# Patient Record
Sex: Male | Born: 1965 | Race: White | Hispanic: No | Marital: Married | Smoking: Never smoker
Health system: Southern US, Community
[De-identification: ages and names within clinical notes are randomized; demographics above are authoritative.]

## PROBLEM LIST (undated history)

## (undated) DIAGNOSIS — I10 Essential (primary) hypertension: Secondary | ICD-10-CM

## (undated) HISTORY — DX: Essential (primary) hypertension: I10

---

## 2007-01-24 ENCOUNTER — Ambulatory Visit (HOSPITAL_COMMUNITY): Admission: RE | Admit: 2007-01-24 | Discharge: 2007-01-24 | Payer: Self-pay | Admitting: Family Medicine

## 2010-05-22 HISTORY — PX: LAPAROSCOPIC GASTRIC BYPASS: SUR771

## 2014-01-20 ENCOUNTER — Other Ambulatory Visit (HOSPITAL_COMMUNITY): Payer: Self-pay | Admitting: Family Medicine

## 2014-01-20 DIAGNOSIS — R198 Other specified symptoms and signs involving the digestive system and abdomen: Secondary | ICD-10-CM

## 2014-01-28 ENCOUNTER — Ambulatory Visit (HOSPITAL_COMMUNITY)
Admission: RE | Admit: 2014-01-28 | Discharge: 2014-01-28 | Disposition: A | Discharge status: Home / Self Care | Payer: Federal, State, Local not specified - PPO | Source: Ambulatory Visit | Attending: Family Medicine | Admitting: Family Medicine

## 2014-01-28 DIAGNOSIS — R198 Other specified symptoms and signs involving the digestive system and abdomen: Secondary | ICD-10-CM

## 2014-01-28 DIAGNOSIS — R1011 Right upper quadrant pain: Secondary | ICD-10-CM | POA: Insufficient documentation

## 2015-12-15 IMAGING — US US ABDOMEN COMPLETE
1 series · 14 of 25 positions shown · non-contrast
Comparison: None.

CLINICAL DATA: Right upper quadrant pain

EXAM:
ULTRASOUND ABDOMEN COMPLETE

[Series 1: us abdomen complete · 0.12mm/px · 14 of 100 slices shown]
[im 1/100]
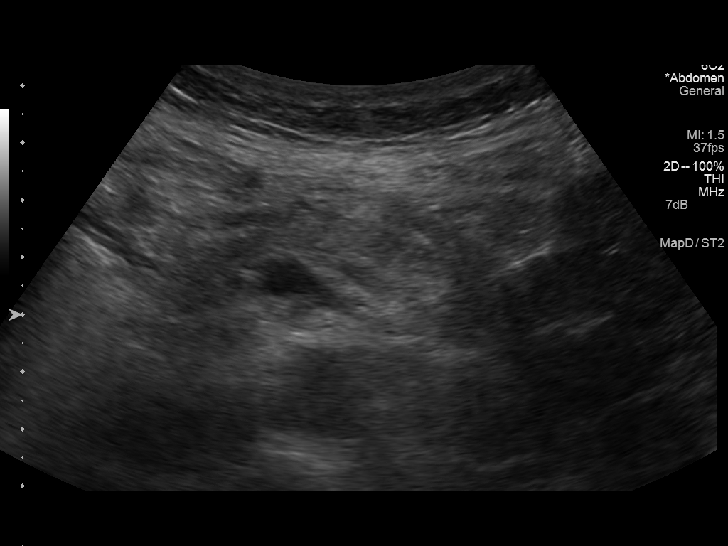
[im 9/100]
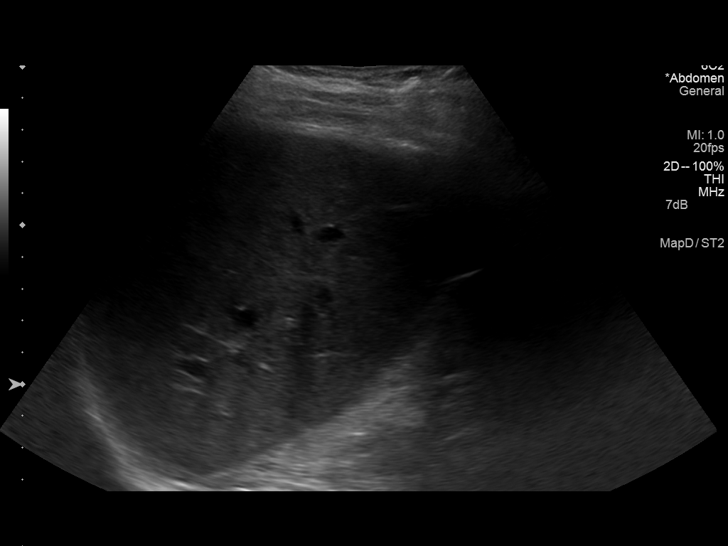
[im 17/100]
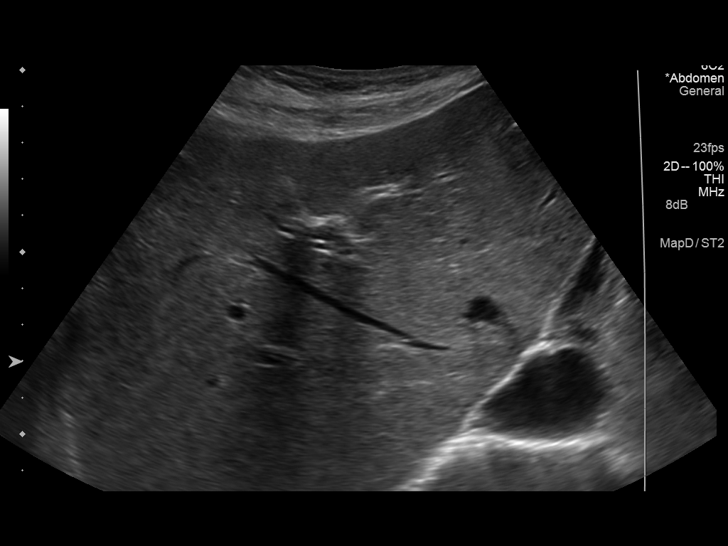
[im 25/100]
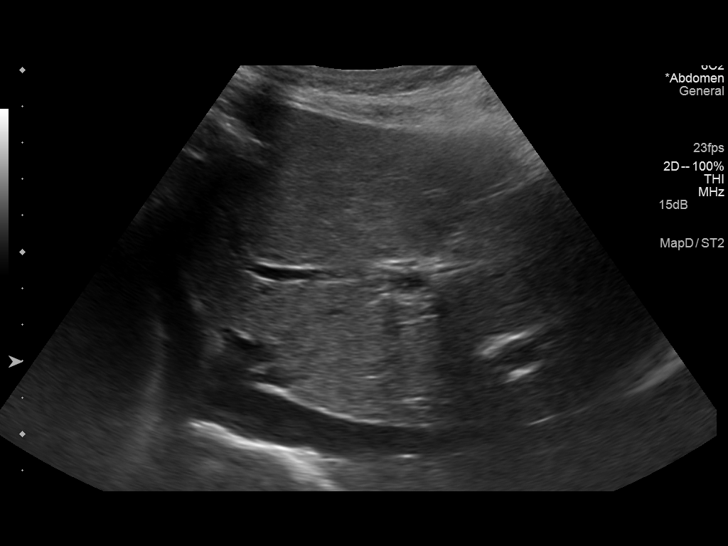
[im 34/100]
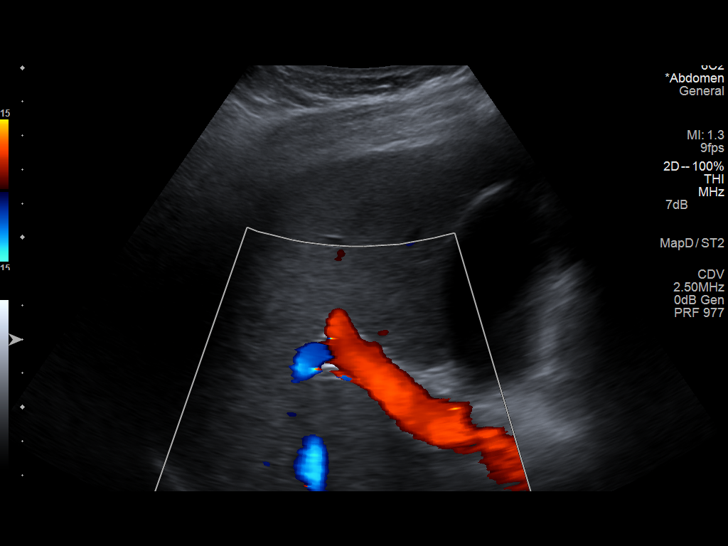
[im 38/100]
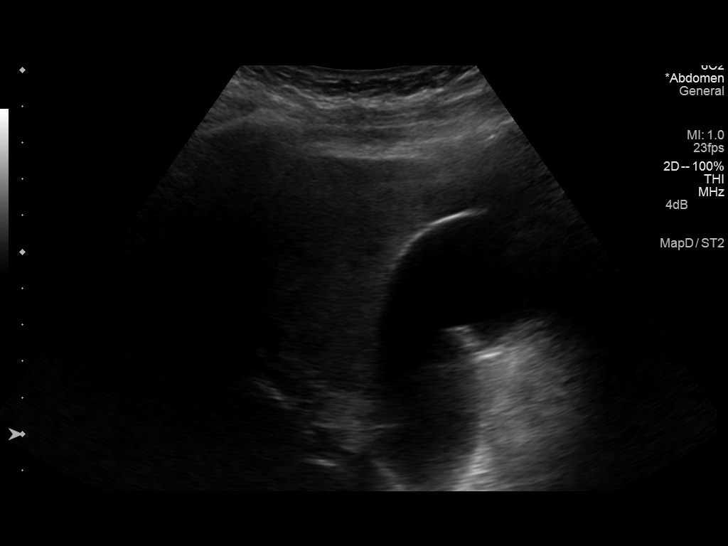
[im 46/100]
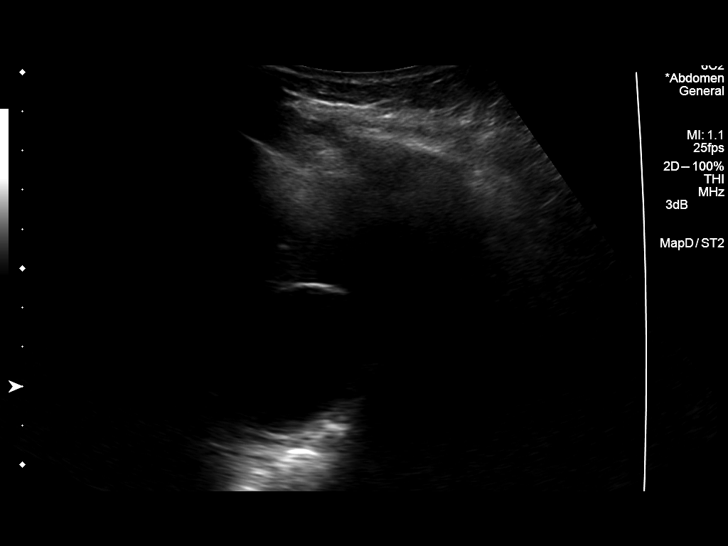
[im 54/100]
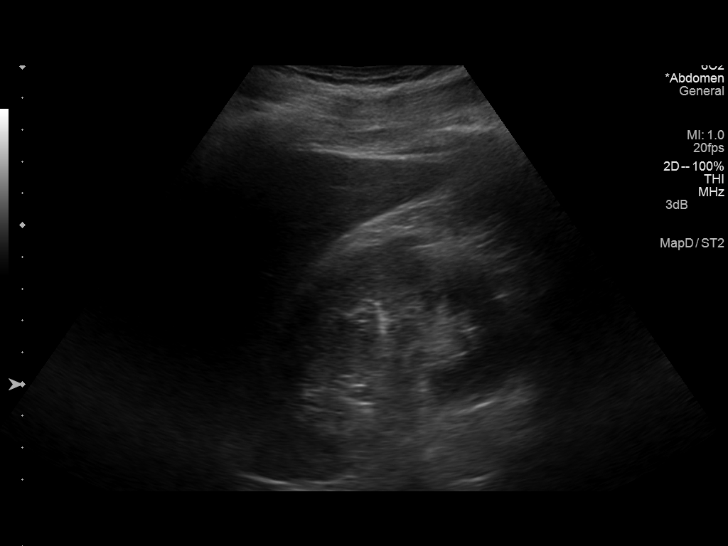
[im 62/100]
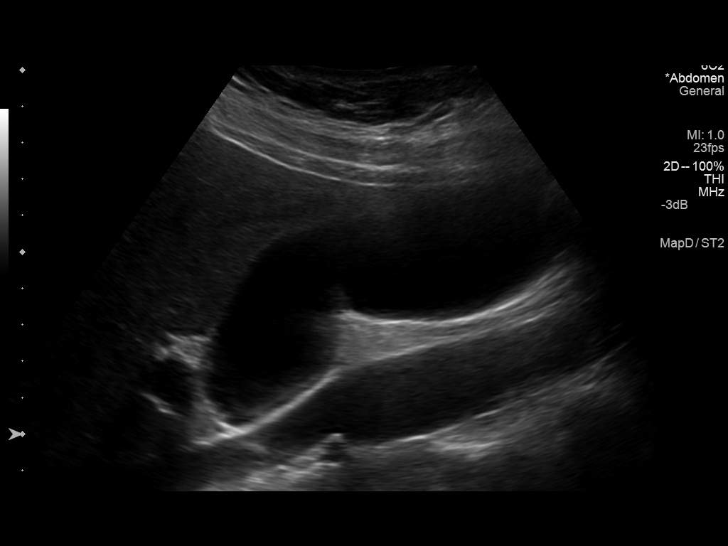
[im 67/100]
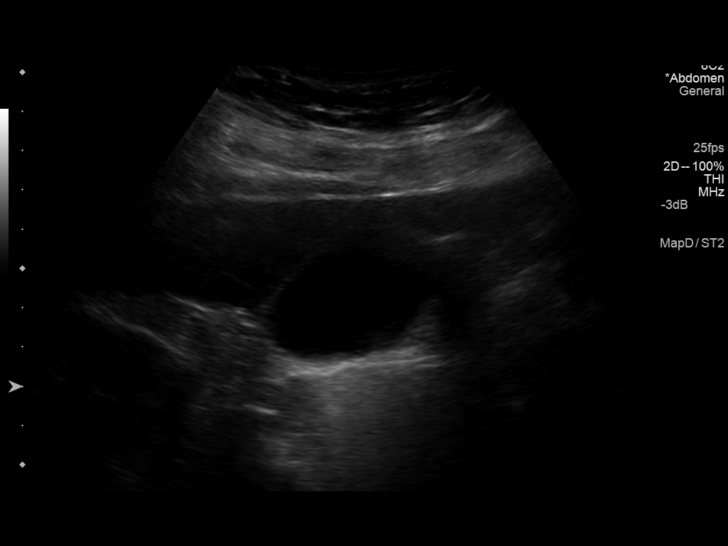
[im 75/100]
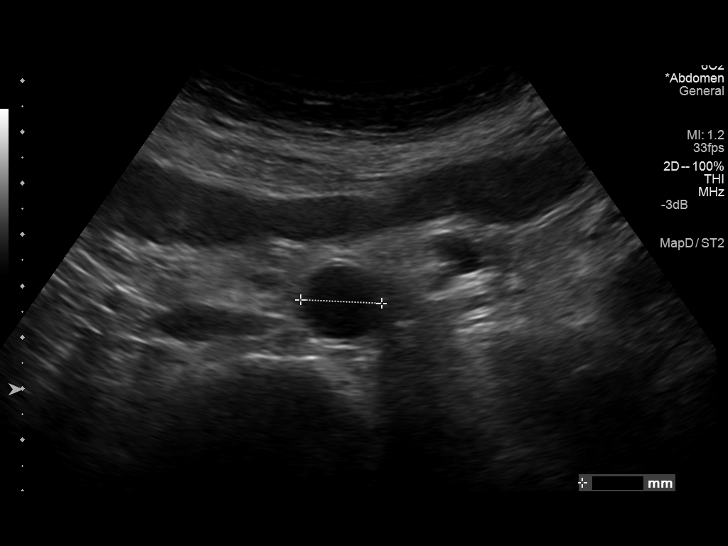
[im 83/100]
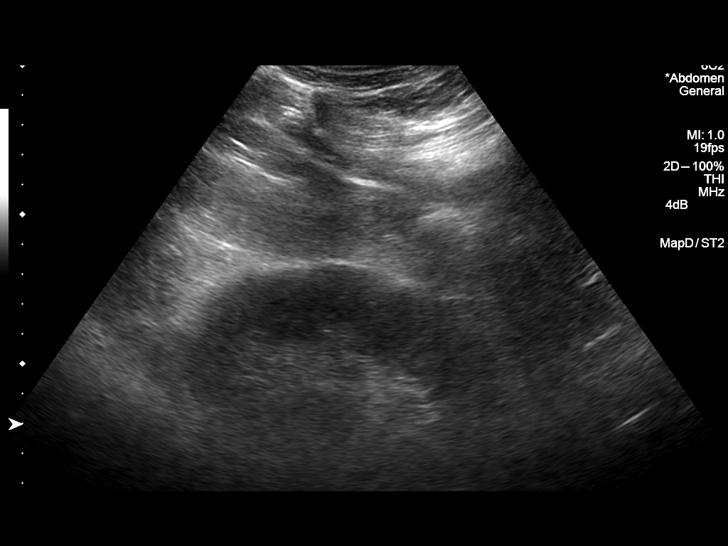
[im 91/100]
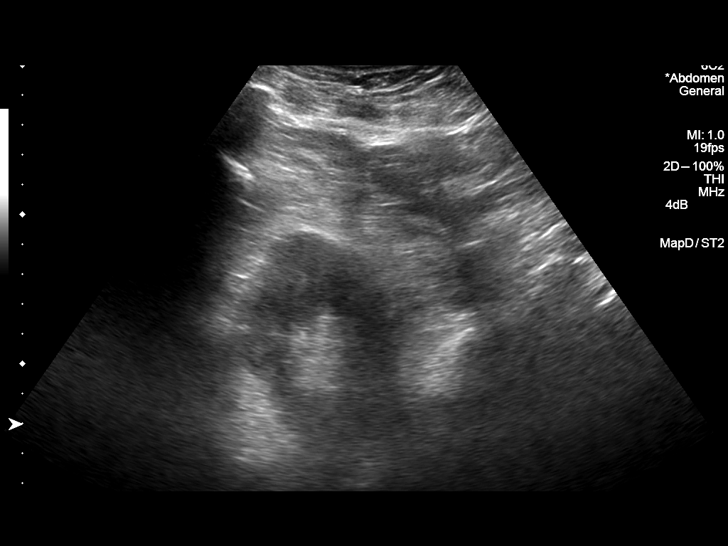
[im 100/100]
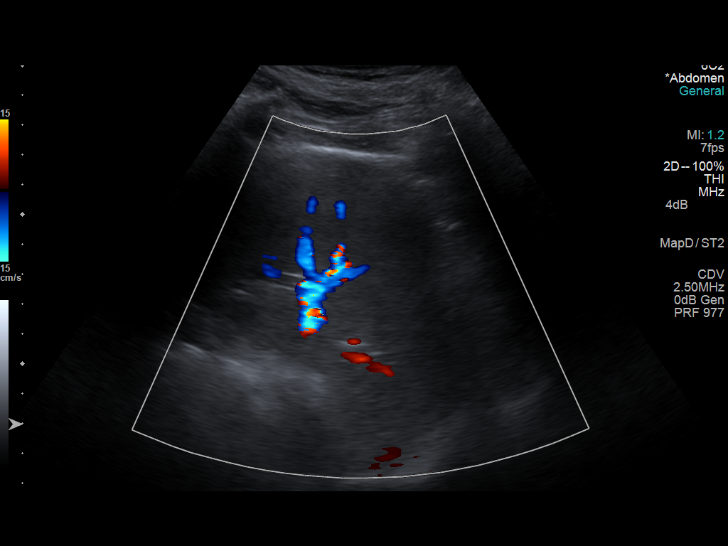

[14 of 25 positions shown; findings below may reference images not displayed]

FINDINGS: Gallbladder:

No gallstones or wall thickening visualized. No sonographic Murphy
sign noted.

Common bile duct:

Diameter: 4.2 mm.

Liver:

No focal lesion identified. Within normal limits in parenchymal
echogenicity.

IVC:

No abnormality visualized.

Pancreas:

Visualized portion unremarkable.

Spleen:

Size and appearance within normal limits.

Right Kidney:

Length: 11.4 cm.. Echogenicity within normal limits. No mass or
hydronephrosis visualized.

Left Kidney:

Length: 11.1 cm.. Echogenicity within normal limits. No mass or
hydronephrosis visualized.

Abdominal aorta:

No aneurysm visualized.

Other findings:

None.
IMPRESSION: No acute abnormality noted.

## 2016-10-16 DIAGNOSIS — Z6829 Body mass index (BMI) 29.0-29.9, adult: Secondary | ICD-10-CM | POA: Diagnosis not present

## 2016-10-16 DIAGNOSIS — Z1389 Encounter for screening for other disorder: Secondary | ICD-10-CM | POA: Diagnosis not present

## 2016-10-16 DIAGNOSIS — L987 Excessive and redundant skin and subcutaneous tissue: Secondary | ICD-10-CM | POA: Diagnosis not present

## 2016-10-16 DIAGNOSIS — E663 Overweight: Secondary | ICD-10-CM | POA: Diagnosis not present

## 2016-10-16 DIAGNOSIS — Z0000 Encounter for general adult medical examination without abnormal findings: Secondary | ICD-10-CM | POA: Diagnosis not present

## 2016-12-27 DIAGNOSIS — K080 Exfoliation of teeth due to systemic causes: Secondary | ICD-10-CM | POA: Diagnosis not present

## 2017-05-01 DIAGNOSIS — R21 Rash and other nonspecific skin eruption: Secondary | ICD-10-CM | POA: Diagnosis not present

## 2017-05-01 DIAGNOSIS — S40861A Insect bite (nonvenomous) of right upper arm, initial encounter: Secondary | ICD-10-CM | POA: Diagnosis not present

## 2017-05-29 DIAGNOSIS — A770 Spotted fever due to Rickettsia rickettsii: Secondary | ICD-10-CM | POA: Diagnosis not present

## 2017-07-08 DIAGNOSIS — K080 Exfoliation of teeth due to systemic causes: Secondary | ICD-10-CM | POA: Diagnosis not present

## 2017-12-25 DIAGNOSIS — E663 Overweight: Secondary | ICD-10-CM | POA: Diagnosis not present

## 2017-12-25 DIAGNOSIS — Z1389 Encounter for screening for other disorder: Secondary | ICD-10-CM | POA: Diagnosis not present

## 2017-12-25 DIAGNOSIS — R000 Tachycardia, unspecified: Secondary | ICD-10-CM | POA: Diagnosis not present

## 2017-12-25 DIAGNOSIS — Z23 Encounter for immunization: Secondary | ICD-10-CM | POA: Diagnosis not present

## 2017-12-25 DIAGNOSIS — Z6829 Body mass index (BMI) 29.0-29.9, adult: Secondary | ICD-10-CM | POA: Diagnosis not present

## 2017-12-25 DIAGNOSIS — Z0001 Encounter for general adult medical examination with abnormal findings: Secondary | ICD-10-CM | POA: Diagnosis not present

## 2017-12-25 DIAGNOSIS — Z9884 Bariatric surgery status: Secondary | ICD-10-CM | POA: Diagnosis not present

## 2017-12-30 ENCOUNTER — Encounter: Payer: Self-pay | Admitting: Gastroenterology

## 2018-01-07 DIAGNOSIS — K080 Exfoliation of teeth due to systemic causes: Secondary | ICD-10-CM | POA: Diagnosis not present

## 2018-01-30 ENCOUNTER — Ambulatory Visit: Payer: Federal, State, Local not specified - PPO

## 2018-02-17 ENCOUNTER — Ambulatory Visit: Payer: Federal, State, Local not specified - PPO

## 2018-02-20 ENCOUNTER — Ambulatory Visit (INDEPENDENT_AMBULATORY_CARE_PROVIDER_SITE_OTHER): Payer: Self-pay

## 2018-02-20 DIAGNOSIS — Z1211 Encounter for screening for malignant neoplasm of colon: Secondary | ICD-10-CM

## 2018-02-20 NOTE — Progress Notes (Signed)
Gastroenterology Pre-Procedure Review  Request Date:02/20/18 Requesting Physician: Dr.Golding (no previous tcs)  PATIENT REVIEW QUESTIONS: The patient responded to the following health history questions as indicated:    1. Diabetes Melitis: no 2. Joint replacements in the past 12 months: no 3. Major health problems in the past 3 months: no 4. Has an artificial valve or MVP: no 5. Has a defibrillator: no 6. Has been advised in past to take antibiotics in advance of a procedure like teeth cleaning: no 7. Family history of colon cancer: no  8. Alcohol Use: no 9. History of sleep apnea: no  10. History of coronary artery or other vascular stents placed within the last 12 months: no 11. History of any prior anesthesia complications: no    MEDICATIONS & ALLERGIES:    Patient reports the following regarding taking any blood thinners:   Plavix? no Aspirin? yes ( ) Coumadin? no Brilinta? no Xarelto? no Eliquis? no Pradaxa? no Savaysa? no Effient? no  Patient confirms/reports the following medications:  Current Outpatient Medications  Medication Sig Dispense Refill  . aspirin EC 81 MG tablet Take by mouth.    . Calcium Carb-Cholecalciferol (OYSTER SHELL CALCIUM/VITAMIN D) 500-200 MG-UNIT TABS Take by mouth.    . Cholecalciferol (VITAMIN D-1000 MAX ST) 1000 units tablet Take by mouth.    . IRON-VITAMIN C PO Take by mouth.    . Multiple Vitamin (MULTIVITAMIN) tablet Take 1 tablet by mouth 3 (three) times daily.     No current facility-administered medications for this visit.     Patient confirms/reports the following allergies:  No Known Allergies  No orders of the defined types were placed in this encounter.   AUTHORIZATION INFORMATION Primary Insurance: BCBS [place],  [place] #: [id number] Pre-Cert / Berkley Harvey required: no   SCHEDULE INFORMATION: Procedure has been scheduled as follows:  Date: 03/21/18, Time: 1:30 Location: [facility] Dr.Fields  This Gastroenterology Pre-Precedure  Review Form is being routed to the following provider(s): Lewie Loron NP

## 2018-02-20 NOTE — Progress Notes (Signed)
Appropriate. Hold iron X 7 days.

## 2018-02-20 NOTE — Patient Instructions (Addendum)
Terry Mccoy  [birth year] MRN: [id number]     Procedure Date: 04/28/18 Time to register: 9:30 Place to register: [facility] Procedure Time: 10:30 Scheduled provider: Barney Drain, MD    PREPARATION FOR COLONOSCOPY WITH SUPREP BOWEL PREP KIT  Note: Suprep Bowel Prep Kit is a split-dose (2day) regimen. Consumption of BOTH 6-ounce bottles is required for a complete prep.  Please notify us immediately if you are diabetic, take iron supplements, or if you are on Coumadin or any other blood thinners.  Hold iron for 7 days prior to procedure.                                                                                                                                                  1 DAY BEFORE PROCEDURE:  DATE: 04/27/18   DAY: Sunday  clear liquids the entire day - NO SOLID FOOD.     At 6:00pm: Complete steps 1 through 4 below, using ONE (1) 6-ounce bottle, before going to bed. Step 1:  Pour ONE (1) 6-ounce bottle of SUPREP liquid into the mixing container.  Step 2:  Add cool drinking water to the 16 ounce line on the container and mix.  Note: Dilute the solution concentrate as directed prior to use. Step 3:  DRINK ALL the liquid in the container. Step 4:  You MUST drink an additional two (2) or more 16 ounce containers of water over the next one (1) hour.   Continue clear liquids only   If you take medications for your heart, blood pressure, or breathing, you may take these medications with a small amount of clear liquid.   DAY OF PROCEDURE:   DATE: 04/28/18  DAY: Monday    5 hours before your procedure at 5:30:am: Step 1:  Pour ONE (1) 6-ounce bottle of SUPREP liquid into the mixing container.  Step 2:  Add cool drinking water to the 16 ounce line on the container and mix.  Note: Dilute the solution concentrate as directed prior to use. Step 3:  DRINK ALL the liquid in the container. Step 4:  You MUST drink an additional two (2) or more 16 ounce containers of  water over the next one (1) hour. You MUST complete the final glass of water at least 3 hours before your colonoscopy.   Nothing by mouth past 7:30am  You may take your morning medications with sip of water unless we have instructed otherwise.    Please see below for Dietary Information.  CLEAR LIQUIDS INCLUDE:  Water Jello (NOT red in color)   Ice Popsicles (NOT red in color)   Tea (sugar ok, no milk/cream) Powdered fruit flavored drinks  Coffee (sugar ok, no milk/cream) Gatorade/ Lemonade/ Kool-Aid  (NOT red in color)   Juice: apple, white grape, white cranberry Soft drinks  Clear bullion, consomme, broth (fat  free beef/chicken/vegetable)  Carbonated beverages (any kind)  Strained chicken noodle soup Hard Candy   Remember: Clear liquids are liquids that will allow you to see your fingers on the other side of a clear glass. Be sure liquids are NOT red in color, and not cloudy, but CLEAR.  DO NOT EAT OR DRINK ANY OF THE FOLLOWING:  Dairy products of any kind   Cranberry juice Tomato juice / V8 juice   Grapefruit juice Orange juice     Red grape juice  Do not eat any solid foods, including such foods as: cereal, oatmeal, yogurt, fruits, vegetables, creamed soups, eggs, bread, crackers, pureed foods in a blender, etc.   HELPFUL HINTS FOR DRINKING PREP SOLUTION:   Make sure prep is extremely cold. Mix and refrigerate the the morning of the prep. You may also put in the freezer.   You may try mixing some Crystal Light or Country Time Lemonade if you prefer. Mix in small amounts; add more if necessary.  Try drinking through a straw  Rinse mouth with water or a mouthwash between glasses, to remove after-taste.  Try sipping on a cold beverage /ice/ popsicles between glasses of prep.  Place a piece of sugar-free hard candy in mouth between glasses.  If you become nauseated, try consuming smaller amounts, or stretch out the time between glasses. Stop for 30-60 minutes, then slowly  start back drinking.     OTHER INSTRUCTIONS  You will need a responsible adult at least 52 years of age to accompany you and drive you home. This person must remain in the waiting room during your procedure. The hospital will cancel your procedure if you do not have a responsible adult with you.   1. Wear loose fitting clothing that is easily removed. 2. Leave jewelry and other valuables at home.  3. Remove all body piercing jewelry and leave at home. 4. Total time from sign-in until discharge is approximately 2-3 hours. 5. You should go home directly after your procedure and rest. You can resume normal activities the day after your procedure. 6. The day of your procedure you should not:  Drive  Make legal decisions  Operate machinery  Drink alcohol  Return to work   You may call the office (Dept: [phone]) before 5:00pm, or page the doctor on call [phone]) after 5:00pm, for further instructions, if necessary.   Insurance Information YOU WILL NEED TO CHECK WITH YOUR INSURANCE COMPANY FOR THE BENEFITS OF COVERAGE YOU HAVE FOR THIS PROCEDURE.  UNFORTUNATELY, NOT ALL INSURANCE COMPANIES HAVE BENEFITS TO COVER ALL OR PART OF THESE TYPES OF PROCEDURES.  IT IS YOUR RESPONSIBILITY TO CHECK YOUR BENEFITS, HOWEVER, WE WILL BE GLAD TO ASSIST YOU WITH ANY CODES YOUR INSURANCE COMPANY MAY NEED.    PLEASE NOTE THAT MOST INSURANCE COMPANIES WILL NOT COVER A SCREENING COLONOSCOPY FOR PEOPLE UNDER THE AGE OF 50  IF YOU HAVE BCBS INSURANCE, YOU MAY HAVE BENEFITS FOR A SCREENING COLONOSCOPY BUT IF POLYPS ARE FOUND THE DIAGNOSIS WILL CHANGE AND THEN YOU MAY HAVE A DEDUCTIBLE THAT WILL NEED TO BE MET. SO PLEASE MAKE SURE YOU CHECK YOUR BENEFITS FOR A SCREENING COLONOSCOPY AS WELL AS A DIAGNOSTIC COLONOSCOPY.

## 2018-02-20 NOTE — Progress Notes (Signed)
Pt is taking iron and taking multivitamins tid. He had gastric bypass.  He is going to check his multivitamins and see if they contain iron.  Does he need to hold his iron for 7 days?

## 2018-02-21 NOTE — Progress Notes (Signed)
Letter mailed to the pt. 

## 2018-02-24 ENCOUNTER — Telehealth: Payer: Self-pay | Admitting: Gastroenterology

## 2018-02-24 NOTE — Telephone Encounter (Signed)
Pt LMOM that he needed to reschedule his colonoscopy on 6/14 with [facility]. Please call [phone]

## 2018-02-24 NOTE — Telephone Encounter (Signed)
Tried to call pt- NA- LMOM 

## 2018-02-28 NOTE — Telephone Encounter (Signed)
Spoke with the pt, changed date of procedure. New instructions mailed to the pt.

## 2018-02-28 NOTE — Progress Notes (Signed)
Pt called and changed date of procedure. New instructions have been mailed and I called Eber Jones to change date and time.

## 2018-03-31 DIAGNOSIS — K080 Exfoliation of teeth due to systemic causes: Secondary | ICD-10-CM | POA: Diagnosis not present

## 2018-04-28 ENCOUNTER — Encounter (HOSPITAL_COMMUNITY)
Admission: RE | Disposition: A | Discharge status: Home / Self Care | Payer: Self-pay | Source: Ambulatory Visit | Attending: Gastroenterology

## 2018-04-28 ENCOUNTER — Ambulatory Visit (HOSPITAL_COMMUNITY)
Admission: RE | Admit: 2018-04-28 | Discharge: 2018-04-28 | Disposition: A | Discharge status: Home / Self Care | Payer: Federal, State, Local not specified - PPO | Source: Ambulatory Visit | Attending: Gastroenterology | Admitting: Gastroenterology

## 2018-04-28 ENCOUNTER — Other Ambulatory Visit: Payer: Self-pay

## 2018-04-28 ENCOUNTER — Encounter (HOSPITAL_COMMUNITY): Payer: Self-pay

## 2018-04-28 DIAGNOSIS — Z79899 Other long term (current) drug therapy: Secondary | ICD-10-CM | POA: Insufficient documentation

## 2018-04-28 DIAGNOSIS — K648 Other hemorrhoids: Secondary | ICD-10-CM | POA: Insufficient documentation

## 2018-04-28 DIAGNOSIS — Z9884 Bariatric surgery status: Secondary | ICD-10-CM | POA: Insufficient documentation

## 2018-04-28 DIAGNOSIS — Q438 Other specified congenital malformations of intestine: Secondary | ICD-10-CM | POA: Diagnosis not present

## 2018-04-28 DIAGNOSIS — Z1211 Encounter for screening for malignant neoplasm of colon: Secondary | ICD-10-CM

## 2018-04-28 DIAGNOSIS — Z7982 Long term (current) use of aspirin: Secondary | ICD-10-CM | POA: Insufficient documentation

## 2018-04-28 HISTORY — PX: COLONOSCOPY: SHX5424

## 2018-04-28 SURGERY — COLONOSCOPY
Anesthesia: Moderate Sedation

## 2018-04-28 MED ORDER — MEPERIDINE HCL 100 MG/ML IJ SOLN
INTRAMUSCULAR | Status: AC
  Filled 2018-04-28: qty 2

## 2018-04-28 MED ORDER — MIDAZOLAM HCL 5 MG/5ML IJ SOLN
INTRAMUSCULAR | Status: DC | PRN
  Administered 2018-04-28: 2 mg via INTRAVENOUS
  Administered 2018-04-28: 1 mg via INTRAVENOUS
  Administered 2018-04-28: 2 mg via INTRAVENOUS

## 2018-04-28 MED ORDER — MEPERIDINE HCL 100 MG/ML IJ SOLN
INTRAMUSCULAR | Status: DC | PRN
  Administered 2018-04-28 (×3): 25 mg via INTRAVENOUS

## 2018-04-28 MED ORDER — SODIUM CHLORIDE 0.9 % IV SOLN: INTRAVENOUS | Status: DC

## 2018-04-28 MED ORDER — MIDAZOLAM HCL 5 MG/5ML IJ SOLN
INTRAMUSCULAR | Status: AC
  Filled 2018-04-28: qty 10

## 2018-04-28 MED ORDER — STERILE WATER FOR IRRIGATION IR SOLN
Status: DC | PRN
  Administered 2018-04-28: 1.5 mL

## 2018-04-28 NOTE — H&P (Signed)
Primary Care Physician:  Sharilyn Sites, MD Primary Gastroenterologist:  Dr. Oneida Alar  Pre-Procedure History & Physical: HPI:  Terry Mccoy is a 52 y.o. male here for COLON CANCER SCREENING.  History reviewed. No pertinent past medical history.  Past Surgical History:  Procedure Laterality Date  . LAPAROSCOPIC GASTRIC BYPASS  05/22/2010    Prior to Admission medications   Medication Sig Start Date End Date Taking? Authorizing Provider  aspirin EC 81 MG tablet Take by mouth.   Yes [provider]  Calcium Carb-Cholecalciferol (OYSTER SHELL CALCIUM/VITAMIN D) 500-200 MG-UNIT TABS Take 2 tablets by mouth 3 (three) times daily.    Yes [provider]  Cholecalciferol (VITAMIN D3) 2000 units capsule Take 2,000 Units by mouth daily.   Yes [provider]  Ferrous Fumarate (IRON) 18 MG TBCR Take 1 tablet by mouth daily.   Yes [provider]  Multiple Vitamin (MULTIVITAMIN) tablet Take 1 tablet by mouth 3 (three) times daily.   Yes [provider]    Allergies as of 02/20/2018  . (No Known Allergies)    Family History  Problem Relation Age of Onset  . Multiple myeloma Brother   . Leukemia Brother   . Colon cancer Neg Hx   . Colon polyps Neg Hx     Social History   Socioeconomic History  . Marital status: Single    Spouse name: Not on file  . Number of children: Not on file  . Years of education: Not on file  . Highest education level: Not on file  Occupational History  . Not on file  Social Needs  . Financial resource strain: Not on file  . Food insecurity:    Worry: Not on file    Inability: Not on file  . Transportation needs:    Medical: Not on file    Non-medical: Not on file  Tobacco Use  . Smoking status: Never Smoker  . Smokeless tobacco: Never Used  Substance and Sexual Activity  . Alcohol use: Never    Frequency: Never  . Drug use: Never  . Sexual activity: Not on file  Lifestyle  . Physical activity:   Days per week: Not on file    Minutes per session: Not on file  . Stress: Not on file  Relationships  . Social connections:    Talks on phone: Not on file    Gets together: Not on file    Attends religious service: Not on file    Active member of club or organization: Not on file    Attends meetings of clubs or organizations: Not on file    Relationship status: Not on file  . Intimate partner violence:    Fear of current or ex partner: Not on file    Emotionally abused: Not on file    Physically abused: Not on file    Forced sexual activity: Not on file  Other Topics Concern  . Not on file  Social History Narrative  . Not on file    Review of Systems: See HPI, otherwise negative ROS   Physical Exam: BP (!) 148/82   Pulse 82   Temp 98.8 F (37.1 C) (Oral)   Resp 12   Ht '5\' 7"'$  (1.702 m)   Wt 185 lb (83.9 kg)   SpO2 100%   BMI 28.98 kg/m  General:   Alert,  pleasant and cooperative in NAD Head:  Normocephalic and atraumatic. Neck:  Supple; Lungs:  Clear throughout to auscultation.  Heart:  Regular rate and rhythm. Abdomen:  Soft, nontender and nondistended. Normal bowel sounds, without guarding, and without rebound.   Neurologic:  Alert and  oriented x4;  grossly normal neurologically.  Impression/Plan:    SCREENING  Plan:  1. TCS TODAY DISCUSSED PROCEDURE, BENEFITS, & RISKS: < 1% chance of medication reaction, bleeding, perforation, or rupture of spleen/liver.

## 2018-04-28 NOTE — Discharge Instructions (Signed)
You have SMALL internal hemorrhoids. YOU DID NOT HAVE ANY POLYPS.   DRINK WATER TO KEEP YOUR URINE LIGHT YELLOW.  FOLLOW A HIGH FIBER DIET. AVOID ITEMS THAT CAUSE BLOATING. SEE INFO BELOW.  USE PREPARATION H FOUR TIMES  A DAY IF NEEDED TO RELIEVE RECTAL PAIN/PRESSURE/BLEEDING.  Next colonoscopy in 10 years.  Colonoscopy Care After Read the instructions outlined below and refer to this sheet in the next week. These discharge instructions provide you with general information on caring for yourself after you leave the hospital. While your treatment has been planned according to the most current medical practices available, unavoidable complications occasionally occur. If you have any problems or questions after discharge, call DR. Eltha Tingley, [phone].  ACTIVITY  You may resume your regular activity, but move at a slower pace for the next 24 hours.   Take frequent rest periods for the next 24 hours.   Walking will help get rid of the air and reduce the bloated feeling in your belly (abdomen).   No driving for 24 hours (because of the medicine (anesthesia) used during the test).   You may shower.   Do not sign any important legal documents or operate any machinery for 24 hours (because of the anesthesia used during the test).    NUTRITION  Drink plenty of fluids.   You may resume your normal diet as instructed by your doctor.   Begin with a light meal and progress to your normal diet. Heavy or fried foods are harder to digest and may make you feel sick to your stomach (nauseated).   Avoid alcoholic beverages for 24 hours or as instructed.    MEDICATIONS  You may resume your normal medications.   WHAT YOU CAN EXPECT TODAY  Some feelings of bloating in the abdomen.   Passage of more gas than usual.   Spotting of blood in your stool or on the toilet paper  .  IF YOU HAD POLYPS REMOVED DURING THE COLONOSCOPY:  Eat a soft diet IF YOU HAVE NAUSEA, BLOATING, ABDOMINAL  PAIN, OR VOMITING.    FINDING OUT THE RESULTS OF YOUR TEST Not all test results are available during your visit. DR. Darrick PennaFIELDS WILL CALL YOU WITHIN 14 DAYS OF YOUR PROCEDUE WITH YOUR RESULTS. Do not assume everything is normal if you have not heard from DR. Shaquila Sigman, CALL HER OFFICE AT [phone].  SEEK IMMEDIATE MEDICAL ATTENTION AND CALL THE OFFICE: [phone] IF:  You have more than a spotting of blood in your stool.   Your belly is swollen (abdominal distention).   You are nauseated or vomiting.   You have a temperature over 101F.   You have abdominal pain or discomfort that is severe or gets worse throughout the day.  High-Fiber Diet A high-fiber diet changes your normal diet to include more whole grains, legumes, fruits, and vegetables. Changes in the diet involve replacing refined carbohydrates with unrefined foods. The calorie level of the diet is essentially unchanged. The Dietary Reference Intake (recommended amount) for adult males is 38 grams per day. For adult females, it is 25 grams per day. Pregnant and lactating women should consume 28 grams of fiber per day. Fiber is the intact part of a plant that is not broken down during digestion. Functional fiber is fiber that has been isolated from the plant to provide a beneficial effect in the body.  PURPOSE  Increase stool bulk.   Ease and regulate bowel movements.   Lower cholesterol.   REDUCE RISK OF COLON  CANCER  INDICATIONS THAT YOU NEED MORE FIBER  Constipation and hemorrhoids.   Uncomplicated diverticulosis (intestine condition) and irritable bowel syndrome.   Weight management.   As a protective measure against hardening of the arteries (atherosclerosis), diabetes, and cancer.   GUIDELINES FOR INCREASING FIBER IN THE DIET  Start adding fiber to the diet slowly. A gradual increase of about 5 more grams (2 slices of whole-wheat bread, 2 servings of most fruits or vegetables, or 1 bowl of high-fiber cereal) per  day is best. Too rapid an increase in fiber may result in constipation, flatulence, and bloating.   Drink enough water and fluids to keep your urine clear or pale yellow. Water, juice, or caffeine-free drinks are recommended. Not drinking enough fluid may cause constipation.   Eat a variety of high-fiber foods rather than one type of fiber.   Try to increase your intake of fiber through using high-fiber foods rather than fiber pills or supplements that contain small amounts of fiber.   The goal is to change the types of food eaten. Do not supplement your present diet with high-fiber foods, but replace foods in your present diet.    INCLUDE A VARIETY OF FIBER SOURCES  Replace refined and processed grains with whole grains, canned fruits with fresh fruits, and incorporate other fiber sources. White rice, white breads, and most bakery goods contain little or no fiber.   Brown whole-grain rice, buckwheat oats, and many fruits and vegetables are all good sources of fiber. These include: broccoli, Brussels sprouts, cabbage, cauliflower, beets, sweet potatoes, white potatoes (skin on), carrots, tomatoes, eggplant, squash, berries, fresh fruits, and dried fruits.   Cereals appear to be the richest source of fiber. Cereal fiber is found in whole grains and bran. Bran is the fiber-rich outer coat of cereal grain, which is largely removed in refining. In whole-grain cereals, the bran remains. In breakfast cereals, the largest amount of fiber is found in those with "bran" in their names. The fiber content is sometimes indicated on the label.   You may need to include additional fruits and vegetables each day.   In baking, for 1 cup white flour, you may use the following substitutions:   1 cup whole-wheat flour minus 2 tablespoons.   1/2 cup white flour plus 1/2 cup whole-wheat flour.   Hemorrhoids Hemorrhoids are dilated (enlarged) veins around the rectum. Sometimes clots will form in the veins. This  makes them swollen and painful. These are called thrombosed hemorrhoids. Causes of hemorrhoids include:  Constipation.   Straining to have a bowel movement.   HEAVY LIFTING  HOME CARE INSTRUCTIONS  Eat a well balanced diet and drink 6 to 8 glasses of water every day to avoid constipation. You may also use a bulk laxative.   Avoid straining to have bowel movements.   Keep anal area dry and clean.   Do not use a donut shaped pillow or sit on the toilet for long periods. This increases blood pooling and pain.   Move your bowels when your body has the urge; this will require less straining and will decrease pain and pressure.

## 2018-04-28 NOTE — Op Note (Signed)
[facility] Patient Name: Terry Mccoy Procedure Date: 04/28/2018 11:07 AM MRN: [id number] Date of Birth: 07/02/1966 Attending MD: Jonette EvaSandi Donaciano Range MD, MD CSN: [id number] Age: [age] Admit Type: Outpatient Procedure:                Colonoscopy, SCREENING Indications:              Screening for colorectal malignant neoplasm Providers:                Jonette EvaSandi Evalise Abruzzese MD, MD, Nena PolioLisa Moore, RN, Burke Keelsrisann                            Tilley, Technician Referring MD:             Corrie MckusickJohn C. Golding MD, MD Medicines:                Meperidine 75 mg IV, Midazolam 5 mg IV Complications:            No immediate complications. Estimated Blood Loss:     Estimated blood loss: none. Procedure:                Pre-Anesthesia Assessment:                           - Prior to the procedure, a History and Physical                            was performed, and patient medications and                            allergies were reviewed. The patient's tolerance of                            previous anesthesia was also reviewed. The risks                            and benefits of the procedure and the sedation                            options and risks were discussed with the patient.                            All questions were answered, and informed consent                            was obtained. Prior Anticoagulants: The patient has                            taken aspirin, last dose was 2 days prior to                            procedure. ASA Grade Assessment: II - A patient                            with mild systemic disease. After reviewing the  risks and benefits, the patient was deemed in                            satisfactory condition to undergo the procedure.                            After obtaining informed consent, the colonoscope                            was passed under direct vision. Throughout the                            procedure, the patient's blood pressure,  pulse, and                            oxygen saturations were monitored continuously. The                            CF-HQ190L ([id number]) scope was introduced through                            the anus and advanced to the the cecum, identified                            by appendiceal orifice and ileocecal valve. The                            colonoscopy was technically difficult and complex                            due to significant looping. Successful completion                            of the procedure was aided by increasing the dose                            of sedation medication, straightening and                            shortening the scope to obtain bowel loop reduction                            and COLOWRAP. The patient tolerated the procedure                            well. The ileocecal valve, appendiceal orifice, and                            rectum were photographed. The quality of the bowel                            preparation was good. Scope In: 11:32:55 AM Scope Out: 11:54:16 AM Scope Withdrawal Time: 0 hours 10 minutes 17 seconds  Total  Procedure Duration: 0 hours 21 minutes 21 seconds  Findings:      The recto-sigmoid colon, sigmoid colon and descending colon revealed       significantly excessive looping.      The exam was otherwise without abnormality.      Internal hemorrhoids were found during retroflexion. The hemorrhoids       were small. Impression:               - There was significant looping of the colon.                           - The examination was otherwise normal.                           - Internal hemorrhoids. Moderate Sedation:      Moderate (conscious) sedation was administered by the endoscopy nurse       and supervised by the endoscopist. The following parameters were       monitored: oxygen saturation, heart rate, blood pressure, and response       to care. Total physician intraservice time was 33 minutes. Recommendation:            - Repeat colonoscopy in 10 years for surveillance.                           - High fiber diet.                           - Continue present medications.                           - Patient has a contact number available for                            emergencies. The signs and symptoms of potential                            delayed complications were discussed with the                            patient. Return to normal activities tomorrow.                            Written discharge instructions were provided to the                            patient. Procedure Code(s):        --- Professional ---                           [phone], Colonoscopy, flexible; diagnostic, including                            collection of specimen(s) by brushing or washing,                            when performed (separate procedure)  G0500, Moderate sedation services provided by the                            same physician or other qualified health care                            professional performing a gastrointestinal                            endoscopic service that sedation supports,                            requiring the presence of an independent trained                            observer to assist in the monitoring of the                            patient's level of consciousness and physiological                            status; initial 15 minutes of intra-service time;                            patient age 52 years or older (additional time may                            be reported with [phone], as appropriate)                           [phone], Moderate sedation services provided by the                            same physician or other qualified health care                            professional performing the diagnostic or                            therapeutic service that the sedation supports,                            requiring the presence of an  independent trained                            observer to assist in the monitoring of the                            patient's level of consciousness and physiological                            status; each additional 15 minutes intraservice  time (List separately in addition to code for                            primary service) Diagnosis Code(s):        --- Professional ---                           Z12.11, Encounter for screening for malignant                            neoplasm of colon                           K64.8, Other hemorrhoids CPT copyright 2017 [employer]. All rights reserved. The codes documented in this report are preliminary and upon coder review may  be revised to meet current compliance requirements. Jonette Eva, MD Jonette Eva MD, MD 04/28/2018 12:03:30 PM This report has been signed electronically. Number of Addenda: 0

## 2018-05-01 ENCOUNTER — Encounter (HOSPITAL_COMMUNITY): Payer: Self-pay | Admitting: Gastroenterology

## 2018-05-20 ENCOUNTER — Ambulatory Visit: Payer: Federal, State, Local not specified - PPO | Admitting: Gastroenterology

## 2018-07-21 DIAGNOSIS — K080 Exfoliation of teeth due to systemic causes: Secondary | ICD-10-CM | POA: Diagnosis not present

## 2018-09-27 DIAGNOSIS — R05 Cough: Secondary | ICD-10-CM | POA: Diagnosis not present

## 2018-09-27 DIAGNOSIS — Z6830 Body mass index (BMI) 30.0-30.9, adult: Secondary | ICD-10-CM | POA: Diagnosis not present

## 2018-11-26 DIAGNOSIS — Z6832 Body mass index (BMI) 32.0-32.9, adult: Secondary | ICD-10-CM | POA: Diagnosis not present

## 2018-11-26 DIAGNOSIS — R6889 Other general symptoms and signs: Secondary | ICD-10-CM | POA: Diagnosis not present

## 2018-11-26 DIAGNOSIS — R509 Fever, unspecified: Secondary | ICD-10-CM | POA: Diagnosis not present

## 2019-07-07 DIAGNOSIS — Z23 Encounter for immunization: Secondary | ICD-10-CM | POA: Diagnosis not present

## 2019-07-07 DIAGNOSIS — Z0001 Encounter for general adult medical examination with abnormal findings: Secondary | ICD-10-CM | POA: Diagnosis not present

## 2019-07-07 DIAGNOSIS — R001 Bradycardia, unspecified: Secondary | ICD-10-CM | POA: Diagnosis not present

## 2019-07-07 DIAGNOSIS — Z6830 Body mass index (BMI) 30.0-30.9, adult: Secondary | ICD-10-CM | POA: Diagnosis not present

## 2019-07-07 DIAGNOSIS — Z1389 Encounter for screening for other disorder: Secondary | ICD-10-CM | POA: Diagnosis not present

## 2019-10-20 DIAGNOSIS — Z23 Encounter for immunization: Secondary | ICD-10-CM | POA: Diagnosis not present

## 2019-11-23 DIAGNOSIS — D2221 Melanocytic nevi of right ear and external auricular canal: Secondary | ICD-10-CM | POA: Diagnosis not present

## 2019-11-23 DIAGNOSIS — L720 Epidermal cyst: Secondary | ICD-10-CM | POA: Diagnosis not present

## 2019-11-23 DIAGNOSIS — D2239 Melanocytic nevi of other parts of face: Secondary | ICD-10-CM | POA: Diagnosis not present

## 2020-03-11 DIAGNOSIS — Z719 Counseling, unspecified: Secondary | ICD-10-CM | POA: Diagnosis not present

## 2020-03-11 DIAGNOSIS — L237 Allergic contact dermatitis due to plants, except food: Secondary | ICD-10-CM | POA: Diagnosis not present

## 2020-08-10 DIAGNOSIS — K080 Exfoliation of teeth due to systemic causes: Secondary | ICD-10-CM | POA: Diagnosis not present

## 2020-11-28 DIAGNOSIS — Z1331 Encounter for screening for depression: Secondary | ICD-10-CM | POA: Diagnosis not present

## 2020-11-28 DIAGNOSIS — Z6831 Body mass index (BMI) 31.0-31.9, adult: Secondary | ICD-10-CM | POA: Diagnosis not present

## 2020-11-28 DIAGNOSIS — Z9884 Bariatric surgery status: Secondary | ICD-10-CM | POA: Diagnosis not present

## 2020-11-28 DIAGNOSIS — Z1389 Encounter for screening for other disorder: Secondary | ICD-10-CM | POA: Diagnosis not present

## 2020-11-28 DIAGNOSIS — R001 Bradycardia, unspecified: Secondary | ICD-10-CM | POA: Diagnosis not present

## 2020-11-28 DIAGNOSIS — Z0001 Encounter for general adult medical examination with abnormal findings: Secondary | ICD-10-CM | POA: Diagnosis not present

## 2021-04-26 DIAGNOSIS — L821 Other seborrheic keratosis: Secondary | ICD-10-CM | POA: Diagnosis not present

## 2021-04-26 DIAGNOSIS — L820 Inflamed seborrheic keratosis: Secondary | ICD-10-CM | POA: Diagnosis not present

## 2021-04-26 DIAGNOSIS — L720 Epidermal cyst: Secondary | ICD-10-CM | POA: Diagnosis not present

## 2021-04-26 DIAGNOSIS — L738 Other specified follicular disorders: Secondary | ICD-10-CM | POA: Diagnosis not present

## 2021-04-26 DIAGNOSIS — D224 Melanocytic nevi of scalp and neck: Secondary | ICD-10-CM | POA: Diagnosis not present

## 2021-08-20 DIAGNOSIS — Z6831 Body mass index (BMI) 31.0-31.9, adult: Secondary | ICD-10-CM | POA: Diagnosis not present

## 2021-08-20 DIAGNOSIS — L237 Allergic contact dermatitis due to plants, except food: Secondary | ICD-10-CM | POA: Diagnosis not present

## 2021-08-24 DIAGNOSIS — R21 Rash and other nonspecific skin eruption: Secondary | ICD-10-CM | POA: Diagnosis not present

## 2021-08-24 DIAGNOSIS — Z6831 Body mass index (BMI) 31.0-31.9, adult: Secondary | ICD-10-CM | POA: Diagnosis not present

## 2021-08-24 DIAGNOSIS — T7840XD Allergy, unspecified, subsequent encounter: Secondary | ICD-10-CM | POA: Diagnosis not present

## 2022-05-16 DIAGNOSIS — M9901 Segmental and somatic dysfunction of cervical region: Secondary | ICD-10-CM | POA: Diagnosis not present

## 2022-05-16 DIAGNOSIS — M9902 Segmental and somatic dysfunction of thoracic region: Secondary | ICD-10-CM | POA: Diagnosis not present

## 2022-05-16 DIAGNOSIS — M4312 Spondylolisthesis, cervical region: Secondary | ICD-10-CM | POA: Diagnosis not present

## 2022-05-16 DIAGNOSIS — M542 Cervicalgia: Secondary | ICD-10-CM | POA: Diagnosis not present

## 2022-05-16 DIAGNOSIS — G542 Cervical root disorders, not elsewhere classified: Secondary | ICD-10-CM | POA: Diagnosis not present

## 2022-05-16 DIAGNOSIS — M47812 Spondylosis without myelopathy or radiculopathy, cervical region: Secondary | ICD-10-CM | POA: Diagnosis not present

## 2022-05-16 DIAGNOSIS — M4004 Postural kyphosis, thoracic region: Secondary | ICD-10-CM | POA: Diagnosis not present

## 2022-05-23 DIAGNOSIS — M4004 Postural kyphosis, thoracic region: Secondary | ICD-10-CM | POA: Diagnosis not present

## 2022-05-23 DIAGNOSIS — M9901 Segmental and somatic dysfunction of cervical region: Secondary | ICD-10-CM | POA: Diagnosis not present

## 2022-05-23 DIAGNOSIS — G542 Cervical root disorders, not elsewhere classified: Secondary | ICD-10-CM | POA: Diagnosis not present

## 2022-05-23 DIAGNOSIS — M9902 Segmental and somatic dysfunction of thoracic region: Secondary | ICD-10-CM | POA: Diagnosis not present

## 2022-06-01 DIAGNOSIS — M4004 Postural kyphosis, thoracic region: Secondary | ICD-10-CM | POA: Diagnosis not present

## 2022-06-01 DIAGNOSIS — M9902 Segmental and somatic dysfunction of thoracic region: Secondary | ICD-10-CM | POA: Diagnosis not present

## 2022-06-01 DIAGNOSIS — G542 Cervical root disorders, not elsewhere classified: Secondary | ICD-10-CM | POA: Diagnosis not present

## 2022-06-01 DIAGNOSIS — M9901 Segmental and somatic dysfunction of cervical region: Secondary | ICD-10-CM | POA: Diagnosis not present

## 2022-06-13 DIAGNOSIS — M9902 Segmental and somatic dysfunction of thoracic region: Secondary | ICD-10-CM | POA: Diagnosis not present

## 2022-06-13 DIAGNOSIS — G542 Cervical root disorders, not elsewhere classified: Secondary | ICD-10-CM | POA: Diagnosis not present

## 2022-06-13 DIAGNOSIS — M9901 Segmental and somatic dysfunction of cervical region: Secondary | ICD-10-CM | POA: Diagnosis not present

## 2022-06-13 DIAGNOSIS — M4004 Postural kyphosis, thoracic region: Secondary | ICD-10-CM | POA: Diagnosis not present

## 2022-07-11 DIAGNOSIS — M4004 Postural kyphosis, thoracic region: Secondary | ICD-10-CM | POA: Diagnosis not present

## 2022-07-11 DIAGNOSIS — G542 Cervical root disorders, not elsewhere classified: Secondary | ICD-10-CM | POA: Diagnosis not present

## 2022-07-11 DIAGNOSIS — M9901 Segmental and somatic dysfunction of cervical region: Secondary | ICD-10-CM | POA: Diagnosis not present

## 2022-07-11 DIAGNOSIS — M9902 Segmental and somatic dysfunction of thoracic region: Secondary | ICD-10-CM | POA: Diagnosis not present

## 2022-09-11 DIAGNOSIS — D2239 Melanocytic nevi of other parts of face: Secondary | ICD-10-CM | POA: Diagnosis not present

## 2022-09-11 DIAGNOSIS — L820 Inflamed seborrheic keratosis: Secondary | ICD-10-CM | POA: Diagnosis not present

## 2022-09-11 DIAGNOSIS — L821 Other seborrheic keratosis: Secondary | ICD-10-CM | POA: Diagnosis not present

## 2022-09-12 ENCOUNTER — Encounter: Payer: Self-pay | Admitting: Internal Medicine

## 2022-09-12 DIAGNOSIS — Z6831 Body mass index (BMI) 31.0-31.9, adult: Secondary | ICD-10-CM | POA: Diagnosis not present

## 2022-09-12 DIAGNOSIS — E6609 Other obesity due to excess calories: Secondary | ICD-10-CM | POA: Diagnosis not present

## 2022-09-12 DIAGNOSIS — Z9884 Bariatric surgery status: Secondary | ICD-10-CM | POA: Diagnosis not present

## 2022-09-12 DIAGNOSIS — R1310 Dysphagia, unspecified: Secondary | ICD-10-CM | POA: Diagnosis not present

## 2022-09-12 DIAGNOSIS — Z1331 Encounter for screening for depression: Secondary | ICD-10-CM | POA: Diagnosis not present

## 2022-09-12 DIAGNOSIS — Z0000 Encounter for general adult medical examination without abnormal findings: Secondary | ICD-10-CM | POA: Diagnosis not present

## 2022-09-14 ENCOUNTER — Other Ambulatory Visit: Payer: Self-pay | Admitting: *Deleted

## 2022-09-14 DIAGNOSIS — I8393 Asymptomatic varicose veins of bilateral lower extremities: Secondary | ICD-10-CM

## 2022-09-21 ENCOUNTER — Encounter: Payer: Federal, State, Local not specified - PPO | Admitting: Vascular Surgery

## 2022-09-21 ENCOUNTER — Encounter (HOSPITAL_COMMUNITY): Payer: Federal, State, Local not specified - PPO

## 2022-10-17 ENCOUNTER — Ambulatory Visit (HOSPITAL_COMMUNITY)
Admission: RE | Admit: 2022-10-17 | Discharge: 2022-10-17 | Disposition: A | Discharge status: Home / Self Care | Payer: Federal, State, Local not specified - PPO | Source: Ambulatory Visit | Attending: Physician Assistant | Admitting: Physician Assistant

## 2022-10-17 ENCOUNTER — Ambulatory Visit: Payer: Federal, State, Local not specified - PPO | Admitting: Physician Assistant

## 2022-10-17 VITALS — BP 152/81 | HR 68 | Temp 97.9°F | Ht 67.0 in | Wt 200.0 lb

## 2022-10-17 DIAGNOSIS — I872 Venous insufficiency (chronic) (peripheral): Secondary | ICD-10-CM

## 2022-10-17 DIAGNOSIS — I8393 Asymptomatic varicose veins of bilateral lower extremities: Secondary | ICD-10-CM

## 2022-10-17 NOTE — Progress Notes (Signed)
Office Note     CC:  follow up Requesting Provider:  Sharilyn Sites, MD  HPI: Terry Mccoy is a 57 y.o. (10/19/1965) male who presents for evaluation of varicose veins of left lower extremity.  He denies any symptoms from varicosities, he is more concerned about the appearance.  He denies any edema of the left leg.  He also denies any history of DVT, venous ulcerations, trauma, or prior vascular interventions.  He is not had any episodes of superficial thrombophlebitis.  He does not wear compression or elevate his legs during the day.  His job involves sitting however patient states he moves around a lot during the day as well.  He denies tobacco use.  He underwent gastric bypass surgery in 2011.  At that time he weighed 350 pounds and now weighs 200 pounds.  He states veins seem to have improved over the years since gastric bypass surgery and weight loss.  History reviewed. No pertinent past medical history.  Past Surgical History:  Procedure Laterality Date   COLONOSCOPY N/A 04/28/2018   Procedure: COLONOSCOPY;  Surgeon: Danie Binder, MD;  Location: [facility];  Service: Endoscopy;  Laterality: N/A;  1:30   LAPAROSCOPIC GASTRIC BYPASS  05/22/2010    Social History   Socioeconomic History   Marital status: Married    Spouse name: Not on file   Number of children: Not on file   Years of education: Not on file   Highest education level: Not on file  Occupational History   Not on file  Tobacco Use   Smoking status: Never   Smokeless tobacco: Never  Vaping Use   Vaping Use: Never used  Substance and Sexual Activity   Alcohol use: Never   Drug use: Never   Sexual activity: Not on file  Other Topics Concern   Not on file  Social History Narrative   Not on file   Social Determinants of Health   Financial Resource Strain: Not on file  Food Insecurity: Not on file  Transportation Needs: Not on file  Physical Activity: Not on file  Stress: Not on file  Social  Connections: Not on file  Intimate Partner Violence: Not on file    Family History  Problem Relation Age of Onset   Multiple myeloma Brother    Leukemia Brother    Colon cancer Neg Hx    Colon polyps Neg Hx     Current Outpatient Medications  Medication Sig Dispense Refill   aspirin EC 81 MG tablet Take by mouth.     Calcium Carb-Cholecalciferol (OYSTER SHELL CALCIUM/VITAMIN D) 500-200 MG-UNIT TABS Take 2 tablets by mouth 3 (three) times daily.      Cholecalciferol (VITAMIN D3) 2000 units capsule Take 2,000 Units by mouth daily.     Ferrous Fumarate (IRON) 18 MG TBCR Take 1 tablet by mouth daily.     Multiple Vitamin (MULTIVITAMIN) tablet Take 1 tablet by mouth 3 (three) times daily.     pantoprazole (PROTONIX) 40 MG tablet Take 40 mg by mouth daily.     No current facility-administered medications for this visit.    No Known Allergies   REVIEW OF SYSTEMS:   [X]  denotes positive finding, [ ]  denotes negative finding Cardiac  Comments:  Chest pain or chest pressure:    Shortness of breath upon exertion:    Short of breath when lying flat:    Irregular heart rhythm:        Vascular    Pain  in calf, thigh, or hip brought on by ambulation:    Pain in feet at night that wakes you up from your sleep:     Blood clot in your veins:    Leg swelling:         Pulmonary    Oxygen at home:    Productive cough:     Wheezing:         Neurologic    Sudden weakness in arms or legs:     Sudden numbness in arms or legs:     Sudden onset of difficulty speaking or slurred speech:    Temporary loss of vision in one eye:     Problems with dizziness:         Gastrointestinal    Blood in stool:     Vomited blood:         Genitourinary    Burning when urinating:     Blood in urine:        Psychiatric    Major depression:         Hematologic    Bleeding problems:    Problems with blood clotting too easily:        Skin    Rashes or ulcers:        Constitutional    Fever  or chills:      PHYSICAL EXAMINATION:  Vitals:   10/17/22 1230  BP: (!) 152/81  Pulse: 68  Temp: 97.9 F (36.6 C)  TempSrc: Temporal  SpO2: 100%  Weight: 200 lb (90.7 kg)  Height: 5\' 7"  (1.702 m)    General:  WDWN in NAD; vital signs documented above Gait: Not observed HENT: WNL, normocephalic Pulmonary: normal non-labored breathing , without Rales, rhonchi,  wheezing Cardiac: regular HR Abdomen: soft, NT, no masses Skin: without rashes Vascular Exam/Pulses:  Right Left  DP 2+ (normal) 2+ (normal)  PT 2+ (normal) 2+ (normal)   Extremities: without ischemic changes, without Gangrene , without cellulitis; without open wounds; varicosity along the medial left lower leg with scattered spider veins around the ankles; no pigmentation changes or edema noted Musculoskeletal: no muscle wasting or atrophy  Neurologic: A&O X 3;  No focal weakness or paresthesias are detected Psychiatric:  The pt has Normal affect.   Non-Invasive Vascular Imaging:   Left lower extremity venous reflux study negative for DVT Negative for deep venous insufficiency Incompetent GSV from the saphenofemoral junction to the mid thigh Incompetent small saphenous vein at the proximal calf    ASSESSMENT/PLAN:: 57 y.o. male here for evaluation of asymptomatic varicose veins of the left lower extremity  -Left lower extremity venous reflux study was negative for DVT.  No reflux noted in the deep venous system.  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  Recommendations included regular use of 15 to 20 mmHg knee-high compression sock, proper leg elevation periodically throughout the day, and avoiding prolonged sitting and standing.  Should he develop symptoms related to his varicosities he will follow-up.  Otherwise he can  follow-up on an as-needed basis.  He is aware we may need to repeat venous reflux study in the future.   Dagoberto Ligas, PA-C [facility] [phone]  Clinic MD:   Donzetta Matters

## 2022-10-17 NOTE — Progress Notes (Unsigned)
GI Office Note    Referring Provider: Sharilyn Sites, MD Primary Care Physician:  Sharilyn Sites, MD  Primary Gastroenterologist: Elon Alas. Abbey Chatters, DO   Chief Complaint   No chief complaint on file.    History of Present Illness   Terry Mccoy is a 57 y.o. male presenting today at the request of Sharilyn Sites, MD for ***dysphagia.   Colonoscopy July 2019 by Dr. Oneida Alar: -significant looping of colon otherwise normal -repeat in 10 years  Per referral paperwork dysphagia was mentioned and given prescription for pantoprazole 40 mg daily. GI referral placed.   Today: Medical history:  Surgical history:  Dysphagia:   Current Outpatient Medications  Medication Sig Dispense Refill   aspirin EC 81 MG tablet Take by mouth.     Calcium Carb-Cholecalciferol (OYSTER SHELL CALCIUM/VITAMIN D) 500-200 MG-UNIT TABS Take 2 tablets by mouth 3 (three) times daily.      Cholecalciferol (VITAMIN D3) 2000 units capsule Take 2,000 Units by mouth daily.     Ferrous Fumarate (IRON) 18 MG TBCR Take 1 tablet by mouth daily.     Multiple Vitamin (MULTIVITAMIN) tablet Take 1 tablet by mouth 3 (three) times daily.     pantoprazole (PROTONIX) 40 MG tablet Take 40 mg by mouth daily.     No current facility-administered medications for this visit.    No past medical history on file.  Past Surgical History:  Procedure Laterality Date   COLONOSCOPY N/A 04/28/2018   Procedure: COLONOSCOPY;  Surgeon: Danie Binder, MD;  Location: [facility];  Service: Endoscopy;  Laterality: N/A;  1:30   LAPAROSCOPIC GASTRIC BYPASS  05/22/2010    Family History  Problem Relation Age of Onset   Multiple myeloma Brother    Leukemia Brother    Colon cancer Neg Hx    Colon polyps Neg Hx     Allergies as of 10/18/2022   (No Known Allergies)    Social History   Socioeconomic History   Marital status: Married    Spouse name: Not on file   Number of children: Not on file   Years of education: Not  on file   Highest education level: Not on file  Occupational History   Not on file  Tobacco Use   Smoking status: Never   Smokeless tobacco: Never  Vaping Use   Vaping Use: Never used  Substance and Sexual Activity   Alcohol use: Never   Drug use: Never   Sexual activity: Not on file  Other Topics Concern   Not on file  Social History Narrative   Not on file   Social Determinants of Health   Financial Resource Strain: Not on file  Food Insecurity: Not on file  Transportation Needs: Not on file  Physical Activity: Not on file  Stress: Not on file  Social Connections: Not on file  Intimate Partner Violence: Not on file     Review of Systems   Gen: Denies any fever, chills, fatigue, weight loss, lack of appetite.  CV: Denies chest pain, heart palpitations, peripheral edema, syncope.  Resp: Denies shortness of breath at rest or with exertion. Denies wheezing or cough.  GI: see HPI GU : Denies urinary burning, urinary frequency, urinary hesitancy MS: Denies joint pain, muscle weakness, cramps, or limitation of movement.  Derm: Denies rash, itching, dry skin Psych: Denies depression, anxiety, memory loss, and confusion Heme: Denies bruising, bleeding, and enlarged lymph nodes.   Physical Exam   There were no vitals taken  for this visit.  General:   Alert and oriented. Pleasant and cooperative. Well-nourished and well-developed.  Head:  Normocephalic and atraumatic. Eyes:  Without icterus, sclera clear and conjunctiva pink.  Ears:  Normal auditory acuity. Mouth:  No deformity or lesions, oral mucosa pink.  Lungs:  Clear to auscultation bilaterally. No wheezes, rales, or rhonchi. No distress.  Heart:  S1, S2 present without murmurs appreciated.  Abdomen:  +BS, soft, non-tender and non-distended. No HSM noted. No guarding or rebound. No masses appreciated.  Rectal:  Deferred  Msk:  Symmetrical without gross deformities. Normal posture. Extremities:  Without  edema. Neurologic:  Alert and  oriented x4;  grossly normal neurologically. Skin:  Intact without significant lesions or rashes. Psych:  Alert and cooperative. Normal mood and affect.   Assessment   Terry Mccoy is a 57 y.o. male with a history of rocky [place] spotted fever in 2018, *** presenting today for evaluation of dysphagia.   Dysphagia:    PLAN   ***    Venetia Night, MSN, FNP-BC, AGACNP-BC [facility]

## 2022-10-18 ENCOUNTER — Ambulatory Visit: Payer: Federal, State, Local not specified - PPO | Admitting: Gastroenterology

## 2022-10-18 ENCOUNTER — Encounter: Payer: Self-pay | Admitting: Gastroenterology

## 2022-10-18 VITALS — BP 147/87 | HR 64 | Temp 97.8°F | Ht 67.0 in | Wt 198.8 lb

## 2022-10-18 DIAGNOSIS — Z9884 Bariatric surgery status: Secondary | ICD-10-CM

## 2022-10-18 DIAGNOSIS — K219 Gastro-esophageal reflux disease without esophagitis: Secondary | ICD-10-CM | POA: Diagnosis not present

## 2022-10-18 DIAGNOSIS — R1310 Dysphagia, unspecified: Secondary | ICD-10-CM

## 2022-10-18 NOTE — Patient Instructions (Addendum)
It was a pleasure to meet you today!  We are scheduling you for an endoscopy with possible dilation in the near future with Dr. Abbey Chatters.  You will receive separate written instructions regarding prep for procedure.  You need to hold your iron for 4 days prior to procedure.  Continue taking your pantoprazole 40 mg daily.  We will plan to follow-up about 6 to 8 weeks after your procedure to see how you are doing.  I want to create trusting relationships with patients. If you receive a survey regarding your visit,  I greatly appreciate you taking time to fill this out on paper or through your MyChart. I value your feedback.  Venetia Night, MSN, FNP-BC, AGACNP-BC [facility]

## 2022-10-19 ENCOUNTER — Telehealth: Payer: Self-pay | Admitting: *Deleted

## 2022-10-19 NOTE — Telephone Encounter (Signed)
[facility] to schedule for EGD/ED with Dr.Carver ASA 2

## 2022-10-23 ENCOUNTER — Ambulatory Visit: Payer: Federal, State, Local not specified - PPO | Admitting: Gastroenterology

## 2022-10-26 ENCOUNTER — Encounter: Payer: Self-pay | Admitting: *Deleted

## 2022-11-28 ENCOUNTER — Encounter (HOSPITAL_COMMUNITY): Payer: Federal, State, Local not specified - PPO

## 2022-11-30 ENCOUNTER — Ambulatory Visit (HOSPITAL_COMMUNITY)
Admission: RE | Admit: 2022-11-30 | Discharge: 2022-11-30 | Disposition: A | Discharge status: Home / Self Care | Payer: Federal, State, Local not specified - PPO | Attending: Internal Medicine | Admitting: Internal Medicine

## 2022-11-30 ENCOUNTER — Ambulatory Visit (HOSPITAL_COMMUNITY): Payer: Federal, State, Local not specified - PPO | Admitting: Anesthesiology

## 2022-11-30 ENCOUNTER — Other Ambulatory Visit: Payer: Self-pay

## 2022-11-30 ENCOUNTER — Encounter (HOSPITAL_COMMUNITY)
Admission: RE | Disposition: A | Discharge status: Home / Self Care | Payer: Self-pay | Source: Home / Self Care | Attending: Internal Medicine

## 2022-11-30 ENCOUNTER — Encounter (HOSPITAL_COMMUNITY): Payer: Self-pay

## 2022-11-30 DIAGNOSIS — I10 Essential (primary) hypertension: Secondary | ICD-10-CM | POA: Diagnosis not present

## 2022-11-30 DIAGNOSIS — Z9884 Bariatric surgery status: Secondary | ICD-10-CM | POA: Insufficient documentation

## 2022-11-30 DIAGNOSIS — K9189 Other postprocedural complications and disorders of digestive system: Secondary | ICD-10-CM | POA: Diagnosis not present

## 2022-11-30 DIAGNOSIS — K219 Gastro-esophageal reflux disease without esophagitis: Secondary | ICD-10-CM | POA: Diagnosis not present

## 2022-11-30 DIAGNOSIS — R1310 Dysphagia, unspecified: Secondary | ICD-10-CM

## 2022-11-30 DIAGNOSIS — Z980 Intestinal bypass and anastomosis status: Secondary | ICD-10-CM | POA: Diagnosis not present

## 2022-11-30 HISTORY — PX: ESOPHAGOGASTRODUODENOSCOPY (EGD) WITH PROPOFOL: SHX5813

## 2022-11-30 HISTORY — PX: BALLOON DILATION: SHX5330

## 2022-11-30 SURGERY — ESOPHAGOGASTRODUODENOSCOPY (EGD) WITH PROPOFOL
Anesthesia: General

## 2022-11-30 MED ORDER — LIDOCAINE HCL (CARDIAC) PF 100 MG/5ML IV SOSY
PREFILLED_SYRINGE | INTRAVENOUS | Status: DC | PRN
  Administered 2022-11-30: 50 mg via INTRAVENOUS

## 2022-11-30 MED ORDER — LACTATED RINGERS IV SOLN: INTRAVENOUS | Status: DC

## 2022-11-30 MED ORDER — PROPOFOL 10 MG/ML IV BOLUS
INTRAVENOUS | Status: DC | PRN
  Administered 2022-11-30: 50 mg via INTRAVENOUS
  Administered 2022-11-30: 100 mg via INTRAVENOUS
  Administered 2022-11-30: 50 mg via INTRAVENOUS

## 2022-11-30 MED ORDER — PANTOPRAZOLE SODIUM 40 MG PO TBEC: 40.0000 mg | DELAYED_RELEASE_TABLET | Freq: Two times a day (BID) | ORAL | 11 refills | Status: DC

## 2022-11-30 NOTE — Discharge Instructions (Addendum)
EGD Discharge instructions Please read the instructions outlined below and refer to this sheet in the next few weeks. These discharge instructions provide you with general information on caring for yourself after you leave the hospital. Your doctor may also give you specific instructions. While your treatment has been planned according to the most current medical practices available, unavoidable complications occasionally occur. If you have any problems or questions after discharge, please call your doctor. ACTIVITY You may resume your regular activity but move at a slower pace for the next 24 hours.  Take frequent rest periods for the next 24 hours.  Walking will help expel (get rid of) the air and reduce the bloated feeling in your abdomen.  No driving for 24 hours (because of the anesthesia (medicine) used during the test).  You may shower.  Do not sign any important legal documents or operate any machinery for 24 hours (because of the anesthesia used during the test).  NUTRITION Drink plenty of fluids.  You may resume your normal diet.  Begin with a light meal and progress to your normal diet.  Avoid alcoholic beverages for 24 hours or as instructed by your caregiver.  MEDICATIONS You may resume your normal medications unless your caregiver tells you otherwise.  WHAT YOU CAN EXPECT TODAY You may experience abdominal discomfort such as a feeling of fullness or "gas" pains.  FOLLOW-UP Your doctor will discuss the results of your test with you.  SEEK IMMEDIATE MEDICAL ATTENTION IF ANY OF THE FOLLOWING OCCUR: Excessive nausea (feeling sick to your stomach) and/or vomiting.  Severe abdominal pain and distention (swelling).  Trouble swallowing.  Temperature over 101 F (37.8 C).  Rectal bleeding or vomiting of blood.    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  This was pretty tight.  I was barely able to get the scope  through this which measures 8.8 mm.  I stretched this with a balloon today up to 15 mm.  Hopefully this helps with your symptoms.  Esophagus appeared normal.  Examined small bowel appeared normal.  I am going to increase his pantoprazole to twice daily.  Follow-up with GI in 3 months.  Message was sent to the office and they will be in touch with you about this appointment. We may need to consider further dilation in the future depending on how you do.  I hope you have a great rest of your week!  Terry Mccoy. Abbey Chatters, D.O. Gastroenterology and Hepatology [facility]

## 2022-11-30 NOTE — H&P (Signed)
Primary Care Physician:  Sharilyn Sites, MD Primary Gastroenterologist:  Dr. Abbey Chatters  Pre-Procedure History & Physical: HPI:  Terry Mccoy is a 57 y.o. male is here for an EGD with possible dilation to be performed for GERD/Dysphagia.   Past Medical History:  Diagnosis Date   HTN (hypertension)    stopped medication afte gastric bypass    Past Surgical History:  Procedure Laterality Date   COLONOSCOPY N/A 04/28/2018   Procedure: COLONOSCOPY;  Surgeon: Danie Binder, MD;  Location: [facility];  Service: Endoscopy;  Laterality: N/A;  1:30   LAPAROSCOPIC GASTRIC BYPASS  05/22/2010    Prior to Admission medications   Medication Sig Start Date End Date Taking? Authorizing Provider  aspirin EC 81 MG tablet Take by mouth.   Yes [provider]  Calcium Carb-Cholecalciferol (OYSTER SHELL CALCIUM/VITAMIN D) 500-200 MG-UNIT TABS Take 2 tablets by mouth 3 (three) times daily.    Yes [provider]  cetirizine (ZYRTEC ALLERGY) 10 MG tablet Take 10 mg by mouth daily. 01/06/22  Yes [provider]  Cholecalciferol (VITAMIN D3) 2000 units capsule Take 2,000 Units by mouth daily.   Yes [provider]  Ferrous Fumarate (IRON) 18 MG TBCR Take 1 tablet by mouth daily.   Yes [provider]  Multiple Vitamin (MULTIVITAMIN) tablet Take 1 tablet by mouth 3 (three) times daily.   Yes [provider]  pantoprazole (PROTONIX) 40 MG tablet Take 40 mg by mouth daily.   Yes [provider]    Allergies as of 10/26/2022   (No Known Allergies)    Family History  Problem Relation Age of Onset   Heart attack Mother 76   Diabetes Mother    Multiple myeloma Brother    Leukemia Brother    Heart disease Maternal Grandmother    Diabetes Maternal Grandmother    Colon cancer Neg Hx    Colon polyps Neg Hx     Social History   Socioeconomic History   Marital status: Married    Spouse name: Not on file   Number of children: Not on file    Years of education: Not on file   Highest education level: Not on file  Occupational History   Not on file  Tobacco Use   Smoking status: Never   Smokeless tobacco: Never  Vaping Use   Vaping Use: Never used  Substance and Sexual Activity   Alcohol use: Never   Drug use: Never   Sexual activity: Not on file  Other Topics Concern   Not on file  Social History Narrative   Not on file   Social Determinants of Health   Financial Resource Strain: Not on file  Food Insecurity: Not on file  Transportation Needs: Not on file  Physical Activity: Not on file  Stress: Not on file  Social Connections: Not on file  Intimate Partner Violence: Not on file    Review of Systems: General: Negative for fever, chills, fatigue, weakness. Eyes: Negative for vision changes.  ENT: Negative for hoarseness, difficulty swallowing , nasal congestion. CV: Negative for chest pain, angina, palpitations, dyspnea on exertion, peripheral edema.  Respiratory: Negative for dyspnea at rest, dyspnea on exertion, cough, sputum, wheezing.  GI: See history of present illness. GU:  Negative for dysuria, hematuria, urinary incontinence, urinary frequency, nocturnal urination.  MS: Negative for joint pain, low back pain.  Derm: Negative for rash or itching.  Neuro: Negative for weakness, abnormal sensation, seizure, frequent headaches, memory loss, confusion.  Psych: Negative for anxiety, depression Endo: Negative for unusual weight change.  Heme: Negative for bruising or bleeding. Allergy: Negative for rash or hives.  Physical Exam: Vital signs in last 24 hours: Temp:  [98.2 F (36.8 C)] 98.2 F (36.8 C) (02/23 0725) Pulse Rate:  [62] 62 (02/23 0725) Resp:  [16] 16 (02/23 0725) BP: (147)/(73) 147/73 (02/23 0725) SpO2:  [100 %] 100 % (02/23 0725) Weight:  [90.7 kg] 90.7 kg (02/23 0725)   General:   Alert,  Well-developed, well-nourished, pleasant and cooperative in NAD Head:  Normocephalic and  atraumatic. Eyes:  Sclera clear, no icterus.   Conjunctiva pink. Ears:  Normal auditory acuity. Nose:  No deformity, discharge,  or lesions. Msk:  Symmetrical without gross deformities. Normal posture. Extremities:  Without clubbing or edema. Neurologic:  Alert and  oriented x4;  grossly normal neurologically. Skin:  Intact without significant lesions or rashes. Psych:  Alert and cooperative. Normal mood and affect.   Impression/Plan: Terry Mccoy is here for an EGD with possible dilation to be performed for GERD/Dysphagia.   Risks, benefits, limitations, imponderables and alternatives regarding EGD have been reviewed with the patient. Questions have been answered. All parties agreeable.

## 2022-11-30 NOTE — Anesthesia Postprocedure Evaluation (Signed)
Anesthesia Post Note  Patient: Terry Mccoy  Procedure(s) Performed: ESOPHAGOGASTRODUODENOSCOPY (EGD) WITH PROPOFOL BALLOON DILATION  Patient location during evaluation: Phase II Anesthesia Type: General Level of consciousness: awake and alert and oriented Pain management: pain level controlled Vital Signs Assessment: post-procedure vital signs reviewed and stable Respiratory status: spontaneous breathing, nonlabored ventilation and respiratory function stable Cardiovascular status: blood pressure returned to baseline and stable Postop Assessment: no apparent nausea or vomiting Anesthetic complications: no  No notable events documented.   Last Vitals:  Vitals:   11/30/22 0725 11/30/22 0849  BP: (!) 147/73 (!) 92/44  Pulse: 62 69  Resp: 16 17  Temp: 36.8 C (!) 36.3 C  SpO2: 100% 98%    Last Pain:  Vitals:   11/30/22 0849  TempSrc: Oral  PainSc: 0-No pain                 [name] C Kayda Allers

## 2022-11-30 NOTE — Transfer of Care (Signed)
Immediate Anesthesia Transfer of Care Note  Patient: Terry Mccoy  Procedure(s) Performed: ESOPHAGOGASTRODUODENOSCOPY (EGD) WITH PROPOFOL BALLOON DILATION  Patient Location: Endoscopy Unit  Anesthesia Type:General  Level of Consciousness: awake and alert   Airway & Oxygen Therapy: Patient Spontanous Breathing  Post-op Assessment: Report given to RN and Post -op Vital signs reviewed and stable  Post vital signs: Reviewed and stable  Last Vitals:  Vitals Value Taken Time  BP 92/44 11/30/22 0849  Temp 36.3 C 11/30/22 0849  Pulse 69 11/30/22 0849  Resp 17 11/30/22 0849  SpO2 98 % 11/30/22 0849    Last Pain:  Vitals:   11/30/22 0849  TempSrc: Oral  PainSc: 0-No pain      Patients Stated Pain Goal: 10 ([id number])  Complications: No notable events documented.

## 2022-11-30 NOTE — Anesthesia Preprocedure Evaluation (Addendum)
Anesthesia Evaluation  Patient identified by MRN, date of birth, ID band Patient awake    Reviewed: Allergy & Precautions, H&P , NPO status , Patient's Chart, lab work & pertinent test results  Airway Mallampati: II  TM Distance: >3 FB Neck ROM: Full    Dental no notable dental hx. (+) Teeth Intact, Dental Advisory Given   Pulmonary neg pulmonary ROS   Pulmonary exam normal breath sounds clear to auscultation       Cardiovascular Exercise Tolerance: Good hypertension (controlled after bypass, stopped taking meds), Normal cardiovascular exam Rhythm:Regular Rate:Normal     Neuro/Psych negative neurological ROS  negative psych ROS   GI/Hepatic Neg liver ROS,GERD  Medicated and Poorly Controlled,,Gastric bypass   Endo/Other  negative endocrine ROS    Renal/GU negative Renal ROS  negative genitourinary   Musculoskeletal negative musculoskeletal ROS (+)    Abdominal   Peds negative pediatric ROS (+)  Hematology negative hematology ROS (+)   Anesthesia Other Findings   Reproductive/Obstetrics negative OB ROS                             Anesthesia Physical Anesthesia Plan  ASA: 2  Anesthesia Plan: General   Post-op Pain Management: Minimal or no pain anticipated   Induction: Intravenous  PONV Risk Score and Plan: 1 and Propofol infusion  Airway Management Planned: Nasal Cannula and Natural Airway  Additional Equipment:   Intra-op Plan:   Post-operative Plan:   Informed Consent: I have reviewed the patients History and Physical, chart, labs and discussed the procedure including the risks, benefits and alternatives for the proposed anesthesia with the patient or authorized representative who has indicated his/her understanding and acceptance.     Dental advisory given  Plan Discussed with: CRNA and Surgeon  Anesthesia Plan Comments:        Anesthesia Quick Evaluation

## 2022-11-30 NOTE — Op Note (Signed)
[facility] Patient Name: Terry Mccoy Procedure Date: 11/30/2022 8:29 AM MRN: [id number] Date of Birth: 1966/09/30 Attending MD: Elon Alas. Abbey Chatters , [place], [id number] CSN: [id number] Age: 57 Admit Type: Outpatient Procedure:                Upper GI endoscopy Indications:              Dysphagia Providers:                Elon Alas. Abbey Chatters, DO, Janeece Riggers, RN, Kristine L.                            Risa Grill, Technician Referring MD:              Medicines:                See the Anesthesia note for documentation of the                            administered medications Complications:            No immediate complications. Estimated Blood Loss:     Estimated blood loss was minimal. Procedure:                Pre-Anesthesia Assessment:                           - The anesthesia plan was to use monitored                            anesthesia care (MAC).                           After obtaining informed consent, the endoscope was                            passed under direct vision. Throughout the                            procedure, the patient's blood pressure, pulse, and                            oxygen saturations were monitored continuously. The                            [id number]) scope was introduced through the                            mouth, and advanced to the efferent jejunal loop.                            The upper GI endoscopy was accomplished without                            difficulty. The patient tolerated the procedure                            well. Scope In: 8:39:15 AM  Scope Out: 8:46:00 AM Total Procedure Duration: 0 hours 6 minutes 45 seconds  Findings:      The Z-line was regular and was found 36 cm from the incisors.      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 This was traversed       with gentle pressure. The jejunojejunal anastomosis was  characterized by       healthy appearing mucosa. The duodenum-to-jejunum limb was not examined       as it could not be reached. A TTS dilator was passed through the scope       through the anastomotic stricture. Dilation with a 07-19-11 mm balloon       dilator was performed to 12 mm. The dilation site was examined and       showed mild improvement in luminal narrowing. A larger TTS dilator was       passed through the scope. Dilation with a 12-13.5-15 mm anastomotic       balloon dilator was performed to 15 mm. The dilation site was examined       and showed mild improvement in luminal narrowing, mild mucusal       disruption. Impression:               - Z-line regular, 36 cm from the incisors.                           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                            Dilated.                           - No specimens collected. Moderate Sedation:      Per Anesthesia Care Recommendation:           - Patient has a contact number available for                            emergencies. The signs and symptoms of potential                            delayed complications were discussed with the                            patient. Return to normal activities tomorrow.                            Written discharge instructions were provided to the                            patient.                           - Resume previous diet.                           -  Continue present medications.                           - Repeat upper endoscopy PRN for retreatment.                           - Return to GI clinic in 3 months.                           - Use Protonix (pantoprazole) 40 mg PO BID. Procedure Code(s):        --- Professional ---                           [phone], Esophagogastroduodenoscopy, flexible,                            transoral; with dilation of gastric/duodenal                             stricture(s) (eg, balloon, bougie) Diagnosis Code(s):        --- Professional ---                           Z98.0, Intestinal bypass and anastomosis status                           R13.10, Dysphagia, unspecified CPT copyright 2022 [employer]. All rights reserved. The codes documented in this report are preliminary and upon coder review may  be revised to meet current compliance requirements. Elon Alas. Abbey Chatters, DO [place] Abbey Chatters, DO 11/30/2022 8:52:30 AM This report has been signed electronically. Number of Addenda: 0

## 2022-12-05 ENCOUNTER — Other Ambulatory Visit: Payer: Self-pay | Admitting: Internal Medicine

## 2022-12-06 ENCOUNTER — Telehealth: Payer: Self-pay

## 2022-12-06 NOTE — Telephone Encounter (Signed)
PA for pantoprazole 40 mg bid was approved from 11/06/2022 through 12/06/2023. Approval letter to be scanned into media.

## 2022-12-07 ENCOUNTER — Encounter (HOSPITAL_COMMUNITY): Payer: Self-pay | Admitting: Internal Medicine

## 2022-12-20 ENCOUNTER — Encounter: Payer: Self-pay | Admitting: Internal Medicine

## 2023-12-01 ENCOUNTER — Other Ambulatory Visit: Payer: Self-pay | Admitting: Internal Medicine
